# Patient Record
Sex: Male | Born: 1980 | Race: White | Hispanic: No | Marital: Married | Smoking: Never smoker
Health system: Southern US, Community
[De-identification: ages and names within clinical notes are randomized; demographics above are authoritative.]

## PROBLEM LIST (undated history)

## (undated) DIAGNOSIS — G43909 Migraine, unspecified, not intractable, without status migrainosus: Secondary | ICD-10-CM

## (undated) DIAGNOSIS — K259 Gastric ulcer, unspecified as acute or chronic, without hemorrhage or perforation: Secondary | ICD-10-CM

## (undated) DIAGNOSIS — I10 Essential (primary) hypertension: Secondary | ICD-10-CM

## (undated) HISTORY — PX: CHEST TUBE INSERTION: SHX231

## (undated) HISTORY — DX: Essential (primary) hypertension: I10

## (undated) HISTORY — DX: Migraine, unspecified, not intractable, without status migrainosus: G43.909

## (undated) HISTORY — DX: Gastric ulcer, unspecified as acute or chronic, without hemorrhage or perforation: K25.9

## (undated) HISTORY — PX: TONSILLECTOMY AND ADENOIDECTOMY: SUR1326

---

## 2017-12-26 DIAGNOSIS — J302 Other seasonal allergic rhinitis: Secondary | ICD-10-CM | POA: Insufficient documentation

## 2018-05-27 DIAGNOSIS — K580 Irritable bowel syndrome with diarrhea: Secondary | ICD-10-CM | POA: Insufficient documentation

## 2018-10-17 DIAGNOSIS — E6609 Other obesity due to excess calories: Secondary | ICD-10-CM | POA: Insufficient documentation

## 2018-10-17 DIAGNOSIS — R0683 Snoring: Secondary | ICD-10-CM | POA: Insufficient documentation

## 2019-03-21 DIAGNOSIS — I10 Essential (primary) hypertension: Secondary | ICD-10-CM | POA: Insufficient documentation

## 2019-05-22 ENCOUNTER — Ambulatory Visit (INDEPENDENT_AMBULATORY_CARE_PROVIDER_SITE_OTHER): Payer: BC Managed Care – PPO

## 2019-05-22 ENCOUNTER — Other Ambulatory Visit: Payer: Self-pay

## 2019-05-22 ENCOUNTER — Encounter: Payer: Self-pay | Admitting: Family Medicine

## 2019-05-22 ENCOUNTER — Ambulatory Visit (INDEPENDENT_AMBULATORY_CARE_PROVIDER_SITE_OTHER): Payer: BC Managed Care – PPO | Admitting: Family Medicine

## 2019-05-22 VITALS — BP 146/95 | HR 88 | Temp 98.2°F | Wt 296.0 lb

## 2019-05-22 DIAGNOSIS — M25511 Pain in right shoulder: Secondary | ICD-10-CM

## 2019-05-22 DIAGNOSIS — M25571 Pain in right ankle and joints of right foot: Secondary | ICD-10-CM

## 2019-05-22 MED ORDER — DICLOFENAC SODIUM 1 % TD GEL: 2.0000 g | Freq: Four times a day (QID) | TRANSDERMAL | 11 refills | Status: AC

## 2019-05-22 NOTE — Progress Notes (Signed)
Subjective:    CC: Ankle pain  HPI:  Right ankle pain.  5 years ago Andre Ortiz was on a ladder and fell landing on the rung of the ladder with his right foot several times.  This resulted and then dorsiflexion and eversion and eversion injuries.  [place].  X-rays were originally negative however subsequent MRI reportedly showed "bruising of the weightbearing bone in the ankle ".  He was managed with cam walker boot and physical therapy and after a few months return to pretty much normal.    In the last 5 years his ankle has been mildly bothersome but largely pretty normal with ability to do most normal activities.    However about 3 weeks ago he was playing disc golf and suffered an inversion injury to his ankle.  He had pain and swelling which have improved a bit in the last 3 weeks but are still quite present.  Additionally he notes a new clicking sensation into the lateral ankle with walking.  He is tried ice rest elevation ibuprofen and a ankle brace which is helped a little.  Right now his symptoms are mild to moderate.  He cannot walk any kind of long distance without having pain at this time.  Additionally in a separate incident about 3 weeks ago he was carrying a heavy dresser and fell over backwards with a dresser almost landing on his chest.  He was able to push the dresser rapidly off of his chest.  A doing so he developed a little bit of pain in the superior to anterior aspect of the right shoulder.  He notes this initially was moderately painful and has gotten a lot better.  However he notes continued intermittent pain into his shoulder with activities.  He denies any radiating pain weakness or numbness fevers or chills.      Past medical history, Surgical history, Family history not pertinant except as noted below, Social history, Allergies, and medications have been entered into the medical record, reviewed, and no changes needed.    Review of Systems: No headache, visual changes, nausea, vomiting, diarrhea, constipation, dizziness, abdominal pain, skin rash, fevers, chills, night sweats, weight loss, swollen lymph nodes, body aches, joint swelling, muscle aches, chest pain, shortness of breath, mood changes, visual or auditory hallucinations.   Objective:    Vitals:   05/22/19 1335  BP: (!) 146/95  Pulse: 88  Temp: 98.2 F (36.8 C)    General: Well Developed, well nourished, and in no acute distress.  Neuro/Psych: Alert and oriented x3, extra-ocular muscles intact, able to move all 4 extremities, sensation grossly intact. Skin: Warm and dry, no rashes noted.  Respiratory: Not using accessory muscles, speaking in full sentences, trachea midline.  Cardiovascular: Pulses palpable, no extremity edema. Abdomen: Does not appear distended. MSK:   Right shoulder: Normal-appearing Normal range of motion. Mildly tender palpation at superior pectoralis near insertion onto the humerus and distal third of clavicle.  [facility] or [facility]. Normal strength. Negative Hawkins and Neer's test. Negative empty can test. Pulses cap refill and sensation are intact bilateral upper extremities.  Right ankle: Normal-appearing with no swelling or bruising. Mildly tender palpation along posterior aspect of distal fibula at lateral malleolus. Normal ankle motion.  Palpable snapping sensation at the lateral ankle with ankle motion. Stable ligamentous exam. Intact strength.  Pulses cap refill and sensation intact into the foot.  Lab and Radiology  Results X-ray images obtained today personally and independently reviewed.  Right ankle: No fractures no deformity.  No visible loose body.  No malalignment.  Minimal degenerative changes.  Right shoulder: No fractures malalignment.  No significant DJD.  Normal-appearing x-ray.  Await formal radiology overread   Limited musculoskeletal ultrasound right  lateral ankle reveals intact and normal-appearing peroneal tendons with no snapping over the lateral malleolus or snapping internally. Normal bony structures otherwise.  Impression and Recommendations:    Assessment and Plan: 38 y.o. male with  Right lateral ankle pain.  Acute on chronic or exacerbation of chronic issue.  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  Plan to treat with home exercise program diclofenac gel and ankle brace.  In 2 weeks if not improving patient will let me know and I will order physical therapy if needed.  Shoulder pain: Likely strain of pectoralis muscle.  Plan for home exercise program and diclofenac gel.  Again if not improving in 2 weeks we will proceed with physical therapy.Marland Kitchen  PDMP not reviewed this encounter. Orders Placed This Encounter  Procedures  . DG Ankle Complete Right    Standing Status:   Future    Number of Occurrences:   1    Standing Expiration Date:   07/22/2020    Order Specific Question:   Reason for Exam (SYMPTOM  OR DIAGNOSIS REQUIRED)    Answer:   eval ankle pain right lat and medial    Order Specific Question:   Preferred imaging location?    Answer:   [facility]    Order Specific Question:   Radiology Contrast Protocol - do NOT remove file path    Answer:   [email or web address]  . DG Shoulder Right    Standing Status:   Future    Number of Occurrences:   1    Standing Expiration Date:   07/22/2020    Order Specific Question:   Reason for Exam (SYMPTOM  OR DIAGNOSIS REQUIRED)    Answer:   eval pain right shoulder    Order Specific Question:   Preferred imaging location?    Answer:   [facility]    Order Specific Question:   Radiology Contrast Protocol - do NOT remove file path    Answer:   [email or web address]   Meds ordered this encounter   Medications  . diclofenac sodium (VOLTAREN) 1 % GEL    Sig: Apply 2 g topically 4 (four) times daily. To affected joint.    Dispense:  100 g    Refill:  11    Discussed warning signs or symptoms. Please see discharge instructions. Patient expresses understanding.

## 2019-05-22 NOTE — Patient Instructions (Addendum)
Thank you for coming in today.  Use the diclofenac gel up to 4x daily as needed.   Do the Shoulder exercises.  About 30 reps 2x daily.  Up to the front,  Up to the Side.  Internal and External rotation  Do the ankle exercises.  Plantarflexion (down) Doresaflexio (up) Inversion (in) Eversion (out)  If not improving in 2 weeks send me a mychart message and I will order PT.   Keep me updated.    Ankle Sprain, Phase I Rehab An ankle sprain is an injury to the ligaments of your ankle. Ankle sprains cause stiffness, loss of motion, and loss of strength. Ask your health care provider which exercises are safe for you. Do exercises exactly as told by your health care provider and adjust them as directed. It is normal to feel mild stretching, pulling, tightness, or discomfort as you do these exercises. Stop right away if you feel sudden pain or your pain gets worse. Do not begin these exercises until told by your health care provider. Stretching and range-of-motion exercises These exercises warm up your muscles and joints and improve the movement and flexibility of your lower leg and ankle. These exercises also help to relieve pain and stiffness. Gastroc and soleus stretch This exercise is also called a calf stretch. It stretches the muscles in the back of the lower leg. These muscles are the gastrocnemius, or gastroc, and the soleus. 1. Sit on the floor with your left / right leg extended. 2. Loop a belt or towel around the ball of your left / right foot. The ball of your foot is on the walking surface, right under your toes. 3. Keep your left / right ankle and foot relaxed and keep your knee straight while you use the belt or towel to pull your foot toward you. You should feel a gentle stretch behind your calf or knee in your gastroc muscle. 4. Hold this position for __________ seconds, then release to the starting position. 5. Repeat the exercise with your knee bent. You can put a pillow or a  rolled bath towel under your knee to support it. You should feel a stretch deep in your calf in the soleus muscle or at your Achilles tendon. Repeat __________ times. Complete this exercise __________ times a day. Ankle alphabet  1. Sit with your left / right leg supported at the lower leg. ? Do not rest your foot on anything. ? Make sure your foot has room to move freely. 2. Think of your left / right foot as a paintbrush. ? Move your foot to trace each letter of the alphabet in the air. Keep your hip and knee still while you trace. ? Make the letters as large as you can without feeling discomfort. 3. Trace every letter from A to Z. Repeat __________ times. Complete this exercise __________ times a day. Strengthening exercises These exercises build strength and endurance in your ankle and lower leg. Endurance is the ability to use your muscles for a long time, even after they get tired. Ankle dorsiflexion  1. Secure a rubber exercise band or tube to an object, such as a table leg, that will stay still when the band is pulled. Secure the other end around your left / right foot. 2. Sit on the floor facing the object, with your left / right leg extended. The band or tube should be slightly tense when your foot is relaxed. 3. Slowly bring your foot toward you, bringing the top of  your foot toward your shin (dorsiflexion), and pulling the band tighter. 4. Hold this position for __________ seconds. 5. Slowly return your foot to the starting position. Repeat __________ times. Complete this exercise __________ times a day. Ankle plantar flexion  1. Sit on the floor with your left / right leg extended. 2. Loop a rubber exercise tube or band around the ball of your left / right foot. The ball of your foot is on the walking surface, right under your toes. ? Hold the ends of the band or tube in your hands. ? The band or tube should be slightly tense when your foot is relaxed. 3. Slowly point your  foot and toes downward to tilt the top of your foot away from your shin (plantar flexion). 4. Hold this position for __________ seconds. 5. Slowly return your foot to the starting position. Repeat __________ times. Complete this exercise __________ times a day. Ankle eversion 1. Sit on the floor with your legs straight out in front of you. 2. Loop a rubber exercise band or tube around the ball of your left / right foot. The ball of your foot is on the walking surface, right under your toes. ? Hold the ends of the band in your hands, or secure the band to a stable object. ? The band or tube should be slightly tense when your foot is relaxed. 3. Slowly push your foot outward, away from your other leg (eversion). 4. Hold this position for __________ seconds. 5. Slowly return your foot to the starting position. Repeat __________ times. Complete this exercise __________ times a day. This information is not intended to replace advice given to you by your health care provider. Make sure you discuss any questions you have with your health care provider. Document Released: 05/24/2005 Document Revised: 02/11/2019 Document Reviewed: 08/05/2018 Elsevier Patient Education  2020 [employer]    Shoulder Impingement Syndrome Rehab Ask your health care provider which exercises are safe for you. Do exercises exactly as told by your health care provider and adjust them as directed. It is normal to feel mild stretching, pulling, tightness, or discomfort as you do these exercises. Stop right away if you feel sudden pain or your pain gets worse. Do not begin these exercises until told by your health care provider. Stretching and range-of-motion exercise This exercise warms up your muscles and joints and improves the movement and flexibility of your shoulder. This exercise also helps to relieve pain and stiffness. Passive horizontal adduction In passive adduction, you use your other hand to move the injured arm  toward your body. The injured arm does not move on its own. In this movement, your arm is moved across your body in the horizontal plane (horizontal adduction). 1. Sit or stand and pull your left / right elbow across your chest, toward your other shoulder. Stop when you feel a gentle stretch in the back of your shoulder and upper arm. ? Keep your arm at shoulder height. ? Keep your arm as close to your body as you comfortably can. 2. Hold for __________ seconds. 3. Slowly return to the starting position. Repeat __________ times. Complete this exercise __________ times a day. Strengthening exercises These exercises build strength and endurance in your shoulder. Endurance is the ability to use your muscles for a long time, even after they get tired. External rotation, isometric This is an exercise in which you press the back of your wrist against a door frame without moving your shoulder joint (isometric). 1.  Stand or sit in a doorway, facing the door frame. 2. Bend your left / right elbow and place the back of your wrist against the door frame. Only the back of your wrist should be touching the frame. Keep your upper arm at your side. 3. Gently press your wrist against the door frame, as if you are trying to push your arm away from your abdomen (external rotation). Press as hard as you are able without pain. ? Avoid shrugging your shoulder while you press your wrist against the door frame. Keep your shoulder blade tucked down toward the middle of your back. 4. Hold for __________ seconds. 5. Slowly release the tension, and relax your muscles completely before you repeat the exercise. Repeat __________ times. Complete this exercise __________ times a day. Internal rotation, isometric This is an exercise in which you press your palm against a door frame without moving your shoulder joint (isometric). 1. Stand or sit in a doorway, facing the door frame. 2. Bend your left / right elbow and place the  palm of your hand against the door frame. Only your palm should be touching the frame. Keep your upper arm at your side. 3. Gently press your hand against the door frame, as if you are trying to push your arm toward your abdomen (internal rotation). Press as hard as you are able without pain. ? Avoid shrugging your shoulder while you press your hand against the door frame. Keep your shoulder blade tucked down toward the middle of your back. 4. Hold for __________ seconds. 5. Slowly release the tension, and relax your muscles completely before you repeat the exercise. Repeat __________ times. Complete this exercise __________ times a day. Scapular protraction, supine  1. Lie on your back on a firm surface (supine position). Hold a __________ weight in your left / right hand. 2. Raise your left / right arm straight into the air so your hand is directly above your shoulder joint. 3. Push the weight into the air so your shoulder (scapula) lifts off the surface that you are lying on. The scapula will push up or forward (protraction). Do not move your head, neck, or back. 4. Hold for __________ seconds. 5. Slowly return to the starting position. Let your muscles relax completely before you repeat this exercise. Repeat __________ times. Complete this exercise __________ times a day. Scapular retraction  1. Sit in a stable chair without armrests, or stand up. 2. Secure an exercise band to a stable object in front of you so the band is at shoulder height. 3. Hold one end of the exercise band in each hand. Your palms should face down. 4. Squeeze your shoulder blades together (retraction) and move your elbows slightly behind you. Do not shrug your shoulders upward while you do this. 5. Hold for __________ seconds. 6. Slowly return to the starting position. Repeat __________ times. Complete this exercise __________ times a day. Shoulder extension  1. Sit in a stable chair without armrests, or stand  up. 2. Secure an exercise band to a stable object in front of you so the band is above shoulder height. 3. Hold one end of the exercise band in each hand. 4. Straighten your elbows and lift your hands up to shoulder height. 5. Squeeze your shoulder blades together and pull your hands down to the sides of your thighs (extension). Stop when your hands are straight down by your sides. Do not let your hands go behind your body. 6. Hold for __________ seconds.  7. Slowly return to the starting position. Repeat __________ times. Complete this exercise __________ times a day. This information is not intended to replace advice given to you by your health care provider. Make sure you discuss any questions you have with your health care provider. Document Released: 10/23/2005 Document Revised: 02/14/2019 Document Reviewed: 11/18/2018 Elsevier Patient Education  2020 [employer]

## 2020-08-01 IMAGING — DX RIGHT ANKLE - COMPLETE 3+ VIEW
3 series · 3 of 3 positions shown · non-contrast
Comparison: None.

CLINICAL DATA: Pain after trauma several months ago.

EXAM:
RIGHT ANKLE - COMPLETE 3+ VIEW

[ankle ap]
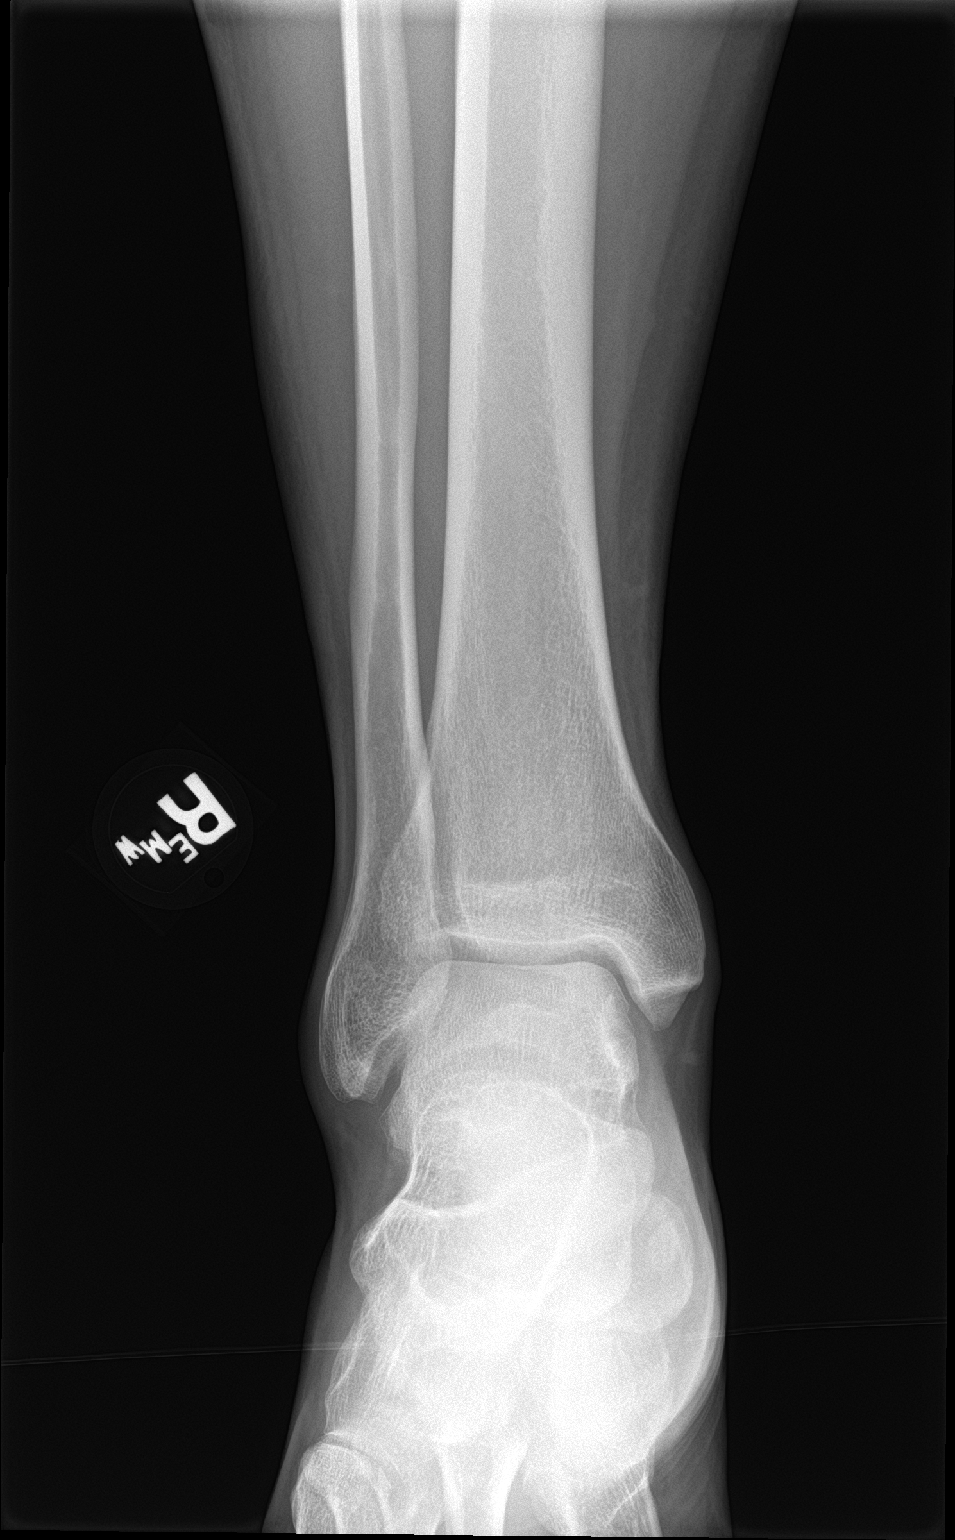

[ankle obl]
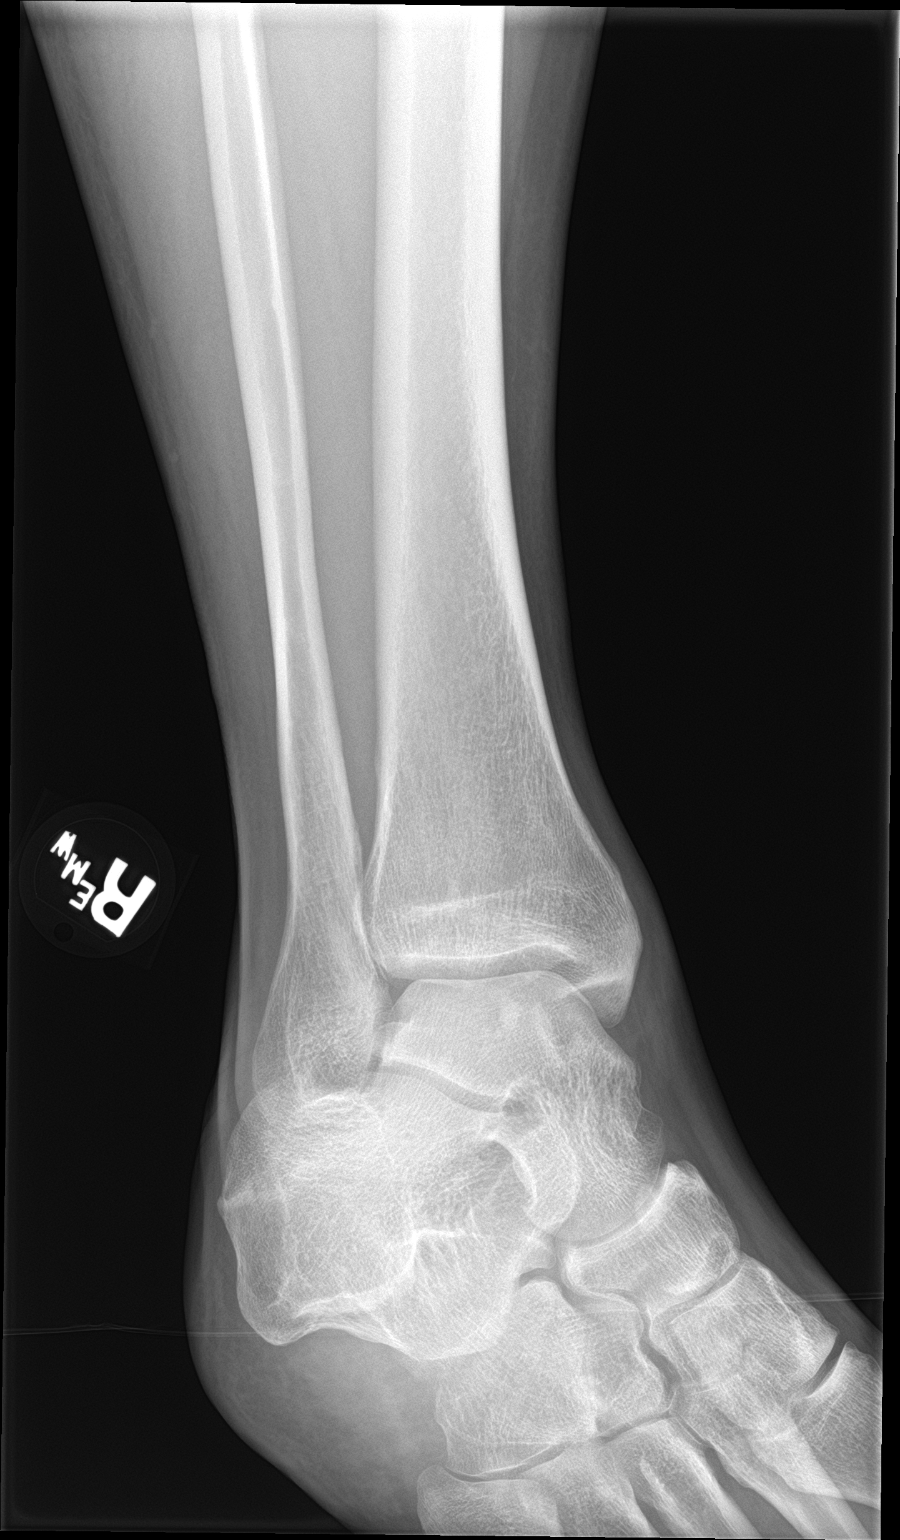

[ankle lat]
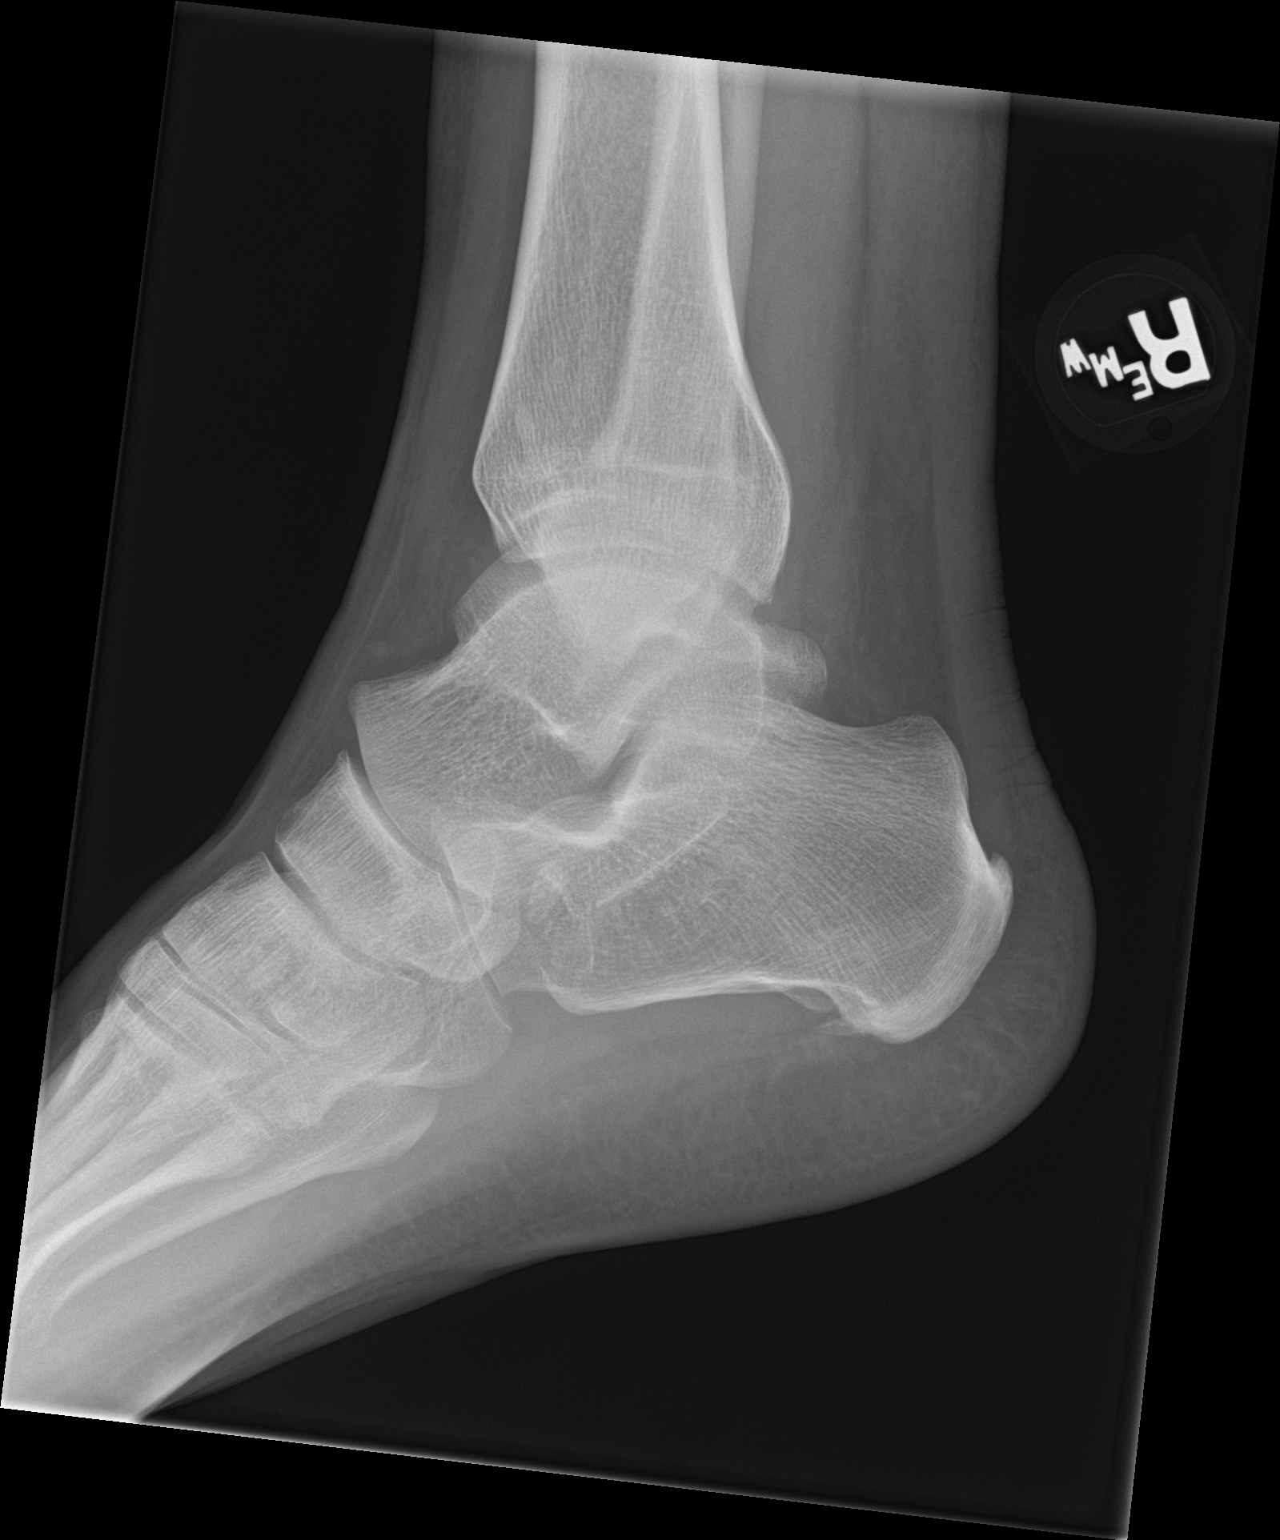

[3 of 3 positions shown; findings below may reference images not displayed]

FINDINGS: There is no evidence of fracture, dislocation, or joint effusion.
There is no evidence of arthropathy or other focal bone abnormality.
Soft tissues are unremarkable.
IMPRESSION: Negative.
